# Patient Record
Sex: Female | Born: 2005
Health system: Southern US, Community
[De-identification: ages and names within clinical notes are randomized; demographics above are authoritative.]

## PROBLEM LIST (undated history)

## (undated) DIAGNOSIS — H539 Unspecified visual disturbance: Secondary | ICD-10-CM

## (undated) DIAGNOSIS — T7840XA Allergy, unspecified, initial encounter: Secondary | ICD-10-CM

## (undated) HISTORY — DX: Allergy, unspecified, initial encounter: T78.40XA

## (undated) HISTORY — DX: Unspecified visual disturbance: H53.9

---
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
## 2023-01-03 ENCOUNTER — Other Ambulatory Visit (HOSPITAL_COMMUNITY): Payer: Self-pay

## 2023-02-08 ENCOUNTER — Other Ambulatory Visit (HOSPITAL_COMMUNITY): Payer: Self-pay

## 2023-03-12 ENCOUNTER — Other Ambulatory Visit (HOSPITAL_COMMUNITY): Payer: Self-pay

## 2023-04-13 ENCOUNTER — Other Ambulatory Visit (HOSPITAL_COMMUNITY): Payer: Self-pay

## 2023-04-24 LAB — T4, FREE: Free T4: 1.2 ng/dL (ref 0.8–1.4)

## 2023-04-24 LAB — TSH: TSH: 0.47 mIU/L — ABNORMAL LOW

## 2023-04-27 ENCOUNTER — Ambulatory Visit (INDEPENDENT_AMBULATORY_CARE_PROVIDER_SITE_OTHER): Payer: Medicaid Other | Admitting: Pediatrics

## 2023-04-27 ENCOUNTER — Encounter (INDEPENDENT_AMBULATORY_CARE_PROVIDER_SITE_OTHER): Payer: Self-pay | Admitting: Pediatrics

## 2023-04-27 ENCOUNTER — Other Ambulatory Visit (HOSPITAL_COMMUNITY): Payer: Self-pay

## 2023-04-27 VITALS — BP 120/76 | HR 74 | Wt 213.0 lb

## 2023-04-27 DIAGNOSIS — E781 Pure hyperglyceridemia: Secondary | ICD-10-CM

## 2023-04-27 DIAGNOSIS — E8881 Metabolic syndrome: Secondary | ICD-10-CM | POA: Insufficient documentation

## 2023-04-27 DIAGNOSIS — E559 Vitamin D deficiency, unspecified: Secondary | ICD-10-CM

## 2023-04-27 DIAGNOSIS — E282 Polycystic ovarian syndrome: Secondary | ICD-10-CM | POA: Diagnosis not present

## 2023-04-27 DIAGNOSIS — Z68.41 Body mass index (BMI) pediatric, greater than or equal to 95th percentile for age: Secondary | ICD-10-CM

## 2023-04-27 DIAGNOSIS — E063 Autoimmune thyroiditis: Secondary | ICD-10-CM | POA: Diagnosis not present

## 2023-04-27 DIAGNOSIS — E786 Lipoprotein deficiency: Secondary | ICD-10-CM

## 2023-04-27 HISTORY — DX: Metabolic syndrome: E88.810

## 2023-04-27 MED ORDER — LEVOTHYROXINE SODIUM 125 MCG PO TABS
125.0000 ug | ORAL_TABLET | Freq: Every day | ORAL | 5 refills | Status: DC
  Filled 2023-04-27 – 2023-05-18 (×2): qty 30, 30d supply, fill #0
  Filled 2023-06-27: qty 30, 30d supply, fill #1
  Filled 2023-07-27: qty 30, 30d supply, fill #2
  Filled 2023-08-29: qty 30, 30d supply, fill #3
  Filled 2023-10-02: qty 30, 30d supply, fill #4

## 2023-04-27 NOTE — Progress Notes (Signed)
Pediatric Endocrinology Consultation Follow-up Visit Rebecca [name] 2006-06-14 [id number] [name], NP (Inactive)   HPI: Rebecca [name]  is a 17 y.o. female presenting for follow-up of Metabolic syndrome, Hypercholesterolemia, and PCOS .  she is unaccompanied to this visit. Interpreter present throughout the visit: No.  Rebecca [name] was last seen at [facility] on 10/27/2022.  Since last visit, she is taking levothyroxine daily with only one missed dose. No symptoms of thyroid disease. She is still taking OTC vit D. She still has irregular periods. She is thinking about studying abroad in [place].   ROS: Greater than 10 systems reviewed with pertinent positives listed in HPI, otherwise neg. The following portions of the patient's history were reviewed and updated as appropriate:  Past Medical History:  has a past medical history of Autoimmune hypothyroidism (2017), Metabolic syndrome (04/27/2023), PCOS (polycystic ovarian syndrome) (10/27/2022), Vision abnormalities, and Vitamin D deficiency (06/14/2016).  Meds: Current Outpatient Medications  Medication Instructions   cholecalciferol (VITAMIN D3) 1,000 Units, Oral, Daily   levothyroxine (SYNTHROID) 125 mcg, Oral, Daily    Allergies: Allergies  Allergen Reactions   Milk-Related Compounds    Shellfish Allergy     All seafood    Surgical History: No past surgical history on file.  Family History: family history is not on file.  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     reports that she has never smoked. She has never been exposed to tobacco smoke. She has never used smokeless tobacco. She reports that she does not drink alcohol and does not use drugs.  Physical Exam:  Vitals:   04/27/23 0952  BP: 120/76  Pulse: 74  Weight: 213 lb (96.6 kg)   BP 120/76   Pulse 74   Wt 213 lb (96.6 kg)   BMI 40.63 kg/m   Body mass index: body mass index is 40.63 kg/m. Blood pressure %iles are not available for patients who are 18 years or older. >99 %ile (Z= 2.34) based on CDC (Girls, 2-20 Years) BMI-for-age data using weight from 04/27/2023 and height from 10/27/2022.  Wt Readings from Last 3 Encounters:  04/27/23 213 lb (96.6 kg) (98 %, Z= 2.12)*  10/27/22 207 lb 9.6 oz (94.2 kg) (98 %, Z= 2.07)*  09/08/22 208 lb 6.4 oz (94.5 kg) (98 %, Z= 2.08)*   * Growth percentiles are based on CDC (Girls, 2-20 Years) data.   Ht Readings from Last 3 Encounters:  10/27/22 5' 0.71" (1.542 m) (8 %, Z= -1.38)*  09/08/22 5' 0.87" (1.546 m) (9 %, Z= -1.32)*  05/17/22 5' 0.55" (1.538 m) (8 %, Z= -1.44)*   * Growth percentiles are based on CDC (Girls, 2-20 Years) data.   Physical Exam Vitals reviewed.  Constitutional:      Appearance: Normal appearance. She is not toxic-appearing.  HENT:     Head: Normocephalic and atraumatic.     Nose: Nose normal.     Mouth/Throat:     Mouth: Mucous membranes are moist.  Eyes:     Extraocular Movements: Extraocular movements intact.  Neck:     Comments: No goiter, cobblestoning texture, no nodules Cardiovascular:     Heart sounds: Normal heart sounds.  Pulmonary:     Effort: Pulmonary effort is normal. No respiratory distress.     Breath sounds: Normal breath sounds.  Abdominal:     General: There is no  distension.  Musculoskeletal:        General: Normal range of motion.     Cervical back: Normal range of motion and neck supple.  Skin:    Capillary Refill: Capillary refill takes less than 2 seconds.     Comments: acanthosis  Neurological:     General: No focal deficit present.     Mental Status: She is alert.     Gait: Gait normal.  Psychiatric:        Mood and Affect: Mood normal.        Behavior: Behavior normal.      Labs: Results for orders placed or performed in visit on 10/27/22  T4, free  Result Value Ref Range   Free T4 1.2 0.8 - 1.4 ng/dL  TSH   Result Value Ref Range   TSH 0.47 (L) mIU/L    Assessment/Plan: Rebecca [name] is a 17 y.o. female with The primary encounter diagnosis was Hypothyroidism, acquired, autoimmune. Diagnoses of PCOS (polycystic ovarian syndrome), Hypertriglyceridemia, Low HDL (under 40), Morbid obesity (HCC), Vitamin D deficiency, and Metabolic syndrome were also pertinent to this visit.  Rebecca [name] was seen today for hypothyroidism.  Hypothyroidism, acquired, autoimmune Overview: Autoimmune hypothyroidism (TPO Ab >900, TH Ab >1000) treated with levothyroxine. she established care with [facility] of Endocrinology 09/19/2016 with Dr. Fransico Michael and transitioned care to me 09/07/2022.  Assessment & Plan: -clinically euthyroid  -TSH at lower end, normal thyroxine level -she has gained weight and does not want to gain more, so will not change dose of levo -Continue levothyroxine daily -repeat TFTs before next visit  Orders: -     Levothyroxine Sodium; Take 1 tablet (125 mcg total) by mouth daily.  Dispense: 30 tablet; Refill: 5 -     T4, free -     TSH -     T3  PCOS (polycystic ovarian syndrome) Overview: PCOS diagnosed as she had elevated testosterone with irregular menses, acne and hirsutism. She has decided to not treat with medication.   Assessment & Plan: Irregular menses persists No tx desired  Orders: -     DHEA-sulfate -     Testosterone, free  Hypertriglyceridemia -     Lipid panel  Low HDL (under 40)  Morbid obesity (HCC) Assessment & Plan: -She has gained 6 pounds in 6 months due to eating out more.  -Dietician offered and she declined.  Orders: -     T4, free -     TSH -     T3 -     Lipid panel -     Hemoglobin A1c -     Comprehensive metabolic panel -     VITAMIN D 25 Hydroxy (Vit-D Deficiency, Fractures) -     DHEA-sulfate -     Testosterone, free  Vitamin D deficiency -     VITAMIN D 25 Hydroxy (Vit-D Deficiency, Fractures)  Metabolic  syndrome Overview: Metabolic syndrome diagnosed as she has acanthosis one exam, PCOS, low HDL, elevated Triglycerides and low vitamin D level. She has seen dietician in the past.   Assessment & Plan: She is at risk of developing diabetes and cardiovascular disease. Again encouraged healthy choices and to continue exercising over the summer.  Orders: -     Lipid panel -     Hemoglobin A1c -     Comprehensive metabolic panel    Patient Instructions  It was a pleasure seeing you today and we will continue your same dose of levothyroxine daily.  Please obtain FASTING labs 1-2 days before the next visit. Remember to get labs done BEFORE the dose of levothyroxine, or 6 hours AFTER the dose of levothyroxine.  [facility] is in our office Monday, Tuesday, Wednesday and Friday from 8AM-4PM, closed for lunch 12pm-1pm. On Thursday, you can go to the third floor, Pediatric Neurology office at [address], [place], [place] [address]. You do not need an appointment, as they see patients in the order they arrive.  Let the front staff know that you are here for labs, and they will help you get to the [facility] lab.    What is thyroid hormone?  Thyroid hormone is the medication prescribed by your child's doctor to treat hypothyroidism, also known as an underactive thyroid gland. The body makes 2 forms of thyroid hormone, levothyroxine (T4) and triiodothyronine (T3). Generally, prescribed thyroid hormone comes in the form of T4, which is converted by the body to the active form, T3. This medication is available in generic form as levothyroxine. Brand names you may encounter for this medication include Levothroid, Levoxyl, Synthroid,  and Unithroid. This medication comes in pill form. Babies who need thyroid hormone because of hypothyroidism must be given this medication on a regular basis so that their brains will develop normally. Babies and older children also need thyroid hormone for normal growth, among other  important body functions.  How should thyroid hormone be given?  For babies and small children, because there is no reliable liquid preparation, the pill should be crushed just before administration and mixed with a small volume of water, human (breast) milk, or formula. This mixture can be given to the baby or small child using a spoon, dropper, or infant syringe. The spoon, dropper, or syringe should be "washed through" with more liquid 2 more times until all the thyroid hormone has been given. Making a mixture of crushed tablets and water or formula for storage is not recommended because this preparation is not stable. Some pharmacies will prepare a compounded suspension of levothyroxine, but it is only guaranteed to be stable for a month and it is more expensive. Levothyroxine is tasteless and should not be a  problem to give.  Older children and teens should be encouraged to swallow the pills whole or with water or to chew the pills if they cannot swallow them. In general, thyroid hormone should be given at the same time of day every day. Despite the instructions you may receive from your pharmacy, thyroid hormone does not need to be taken on an empty stomach. However, its absorption may be affected by food, so it should be taken consistently with or without food.   However, please avoid consuming the following foods or supplements with the thyroid hormone because they may prevent the medicine from being fully absorbed:   Soy protein formulas or soy milk  Concentrated iron  Calcium supplements, aluminum hydroxide  Fiber supplements  Sucralfate  You do not need to worry about thyroid hormones interacting with other medications, as the medicine simply replaces a hormone that your child is no longer able to make. A good way to keep track of your child's doses is to get a 7-day pillbox and fill it at the beginning of the week. If one dose is missed, that dose should be taken as soon as possible. If  you find out one day that the previous dose was missed, it is fine to double the dose the next day.  What are the side effects  of thyroid hormone medication?  The rare side effects of thyroid hormone medication are related to overdose, or too much medication, and can include rapid heart rate, sweating, anxiety, and tremors. If your child experiences these signs and symptoms, you should contact the physician who prescribed the medication for your child. A child will not have these problems if the thyroid hormone dose prescribed is only slightly more than is needed.  Is it OK to switch between brands of thyroid hormone medication?  Some endocrinologists believe that this may not always be a good idea. It is possible that different brands have different bioavailability of the "free" hormone; therefore, if you need to switch between name brands or switch from a name brand to generic levothyroxine, you should let your endocrinologist know so your child's thyroid functions can be checked if the endocrinologist feels it is necessary to do so. Once-daily administration and close follow-up with your endocrinologist is needed to ensure the best possible results.  Pediatric Endocrinology Fact Sheet Thyroid Hormone Administration: A Guide for Families Copyright  2018 American Academy of Pediatrics and Pediatric Endocrine Society. All rights reserved. The information contained in this publication should not be used as a substitute for the medical care and advice of your pediatrician. There may be variations in treatment that your pediatrician may recommend based on individual facts and circumstances. Pediatric Endocrine Society/American Academy of Pediatrics  Section on Endocrinology Patient Education Committee   Follow-up:   Return in about 6 months (around 10/28/2023) for to review studies, follow up.  Medical decision-making:  I have personally spent 48 minutes involved in face-to-face and non-face-to-face  activities for this patient on the day of the visit. Professional time spent includes the following activities, in addition to those noted in the documentation: preparation time/chart review, ordering of medications/tests/procedures, obtaining and/or reviewing separately obtained history, counseling and educating the patient/family/caregiver, performing a medically appropriate examination and/or evaluation, referring and communicating with other health care professionals for care coordination, and documentation in the EHR.  Thank you for the opportunity to participate in the care of your patient. Please do not hesitate to contact me should you have any questions regarding the assessment or treatment plan.   Sincerely,   Silvana Newness, MD

## 2023-04-27 NOTE — Assessment & Plan Note (Signed)
-  She has gained 6 pounds in 6 months due to eating out more.  -Dietician offered and she declined.

## 2023-04-27 NOTE — Assessment & Plan Note (Signed)
Irregular menses persists No tx desired

## 2023-04-27 NOTE — Assessment & Plan Note (Signed)
She is at risk of developing diabetes and cardiovascular disease. Again encouraged healthy choices and to continue exercising over the summer.

## 2023-04-27 NOTE — Assessment & Plan Note (Signed)
-  clinically euthyroid  -TSH at lower end, normal thyroxine level -she has gained weight and does not want to gain more, so will not change dose of levo -Continue levothyroxine daily -repeat TFTs before next visit

## 2023-04-27 NOTE — Patient Instructions (Signed)
It was a pleasure seeing you today and we will continue your same dose of levothyroxine daily.   Please obtain FASTING labs 1-2 days before the next visit. Remember to get labs done BEFORE the dose of levothyroxine, or 6 hours AFTER the dose of levothyroxine.  [facility] is in our office Monday, Tuesday, Wednesday and Friday from 8AM-4PM, closed for lunch 12pm-1pm. On Thursday, you can go to the third floor, Pediatric Neurology office at [address], [place], [place] [address]. You do not need an appointment, as they see patients in the order they arrive.  Let the front staff know that you are here for labs, and they will help you get to the [facility] lab.    What is thyroid hormone?  Thyroid hormone is the medication prescribed by your child's doctor to treat hypothyroidism, also known as an underactive thyroid gland. The body makes 2 forms of thyroid hormone, levothyroxine (T4) and triiodothyronine (T3). Generally, prescribed thyroid hormone comes in the form of T4, which is converted by the body to the active form, T3. This medication is available in generic form as levothyroxine. Brand names you may encounter for this medication include Levothroid, Levoxyl, Synthroid,  and Unithroid. This medication comes in pill form. Babies who need thyroid hormone because of hypothyroidism must be given this medication on a regular basis so that their brains will develop normally. Babies and older children also need thyroid hormone for normal growth, among other important body functions.  How should thyroid hormone be given?  For babies and small children, because there is no reliable liquid preparation, the pill should be crushed just before administration and mixed with a small volume of water, human (breast) milk, or formula. This mixture can be given to the baby or small child using a spoon, dropper, or infant syringe. The spoon, dropper, or syringe should be "washed through" with more liquid 2 more times until all the  thyroid hormone has been given. Making a mixture of crushed tablets and water or formula for storage is not recommended because this preparation is not stable. Some pharmacies will prepare a compounded suspension of levothyroxine, but it is only guaranteed to be stable for a month and it is more expensive. Levothyroxine is tasteless and should not be a  problem to give.  Older children and teens should be encouraged to swallow the pills whole or with water or to chew the pills if they cannot swallow them. In general, thyroid hormone should be given at the same time of day every day. Despite the instructions you may receive from your pharmacy, thyroid hormone does not need to be taken on an empty stomach. However, its absorption may be affected by food, so it should be taken consistently with or without food.   However, please avoid consuming the following foods or supplements with the thyroid hormone because they may prevent the medicine from being fully absorbed:   Soy protein formulas or soy milk  Concentrated iron  Calcium supplements, aluminum hydroxide  Fiber supplements  Sucralfate  You do not need to worry about thyroid hormones interacting with other medications, as the medicine simply replaces a hormone that your child is no longer able to make. A good way to keep track of your child's doses is to get a 7-day pillbox and fill it at the beginning of the week. If one dose is missed, that dose should be taken as soon as possible. If you find out one day that the previous dose  was missed, it is fine to double the dose the next day.  What are the side effects of thyroid hormone medication?  The rare side effects of thyroid hormone medication are related to overdose, or too much medication, and can include rapid heart rate, sweating, anxiety, and tremors. If your child experiences these signs and symptoms, you should contact the physician who prescribed the medication for your child. A child will  not have these problems if the thyroid hormone dose prescribed is only slightly more than is needed.  Is it OK to switch between brands of thyroid hormone medication?  Some endocrinologists believe that this may not always be a good idea. It is possible that different brands have different bioavailability of the "free" hormone; therefore, if you need to switch between name brands or switch from a name brand to generic levothyroxine, you should let your endocrinologist know so your child's thyroid functions can be checked if the endocrinologist feels it is necessary to do so. Once-daily administration and close follow-up with your endocrinologist is needed to ensure the best possible results.  Pediatric Endocrinology Fact Sheet Thyroid Hormone Administration: A Guide for Families Copyright  2018 American Academy of Pediatrics and Pediatric Endocrine Society. All rights reserved. The information contained in this publication should not be used as a substitute for the medical care and advice of your pediatrician. There may be variations in treatment that your pediatrician may recommend based on individual facts and circumstances. Pediatric Endocrine Society/American Academy of Pediatrics  Section on Endocrinology Patient Education Committee

## 2023-05-18 ENCOUNTER — Other Ambulatory Visit (HOSPITAL_COMMUNITY): Payer: Self-pay

## 2023-05-18 ENCOUNTER — Other Ambulatory Visit: Payer: Self-pay

## 2023-06-27 ENCOUNTER — Other Ambulatory Visit (HOSPITAL_COMMUNITY): Payer: Self-pay

## 2023-07-27 ENCOUNTER — Other Ambulatory Visit (HOSPITAL_COMMUNITY): Payer: Self-pay

## 2023-08-29 ENCOUNTER — Other Ambulatory Visit (HOSPITAL_COMMUNITY): Payer: Self-pay

## 2023-10-02 ENCOUNTER — Other Ambulatory Visit (HOSPITAL_COMMUNITY): Payer: Self-pay

## 2023-10-05 ENCOUNTER — Other Ambulatory Visit (HOSPITAL_COMMUNITY): Payer: Self-pay

## 2023-10-26 NOTE — Progress Notes (Signed)
 Pediatric Endocrinology Consultation Follow-up Visit Rebecca [name] 20-Oct-2006 [id number] Tess Barns, NP (Inactive)   HPI: Rebecca [name]  is a 18 y.o. female presenting for follow-up of Metabolic syndrome, PCOS, and Hypothyroidism.  she is accompanied to this visit by her  self . Interpreter present throughout the visit: No.  Taj was last seen at [facility] on 04/27/2023.  Since last visit, she has not yet transitioned to adult endo. Daryl Robb has been taking levo 125mcg with 1-2 missed doses. There has been no heat/cold intolerance, constipation/diarrhea, rapid heart rate, tremor, mood changes, poor energy, fatigue, dry skin, nor brittle hair/hair loss. She has had more hirsutism. She is still ok with not having menses. LMP September 2024 x 1 week duration.   ROS: Greater than 10 systems reviewed with pertinent positives listed in HPI, otherwise neg. The following portions of the patient's history were reviewed and updated as appropriate:  Past Medical History:  has a past medical history of Allergy, Autoimmune hypothyroidism (2017), Metabolic syndrome (04/27/2023), PCOS (polycystic ovarian syndrome) (10/27/2022), Vision abnormalities, and Vitamin D  deficiency (06/14/2016).  Meds: Current Outpatient Medications  Medication Instructions   cholecalciferol (VITAMIN D3) 1,000 Units, Daily   ergocalciferol  (VITAMIN D2) 50,000 Units, Oral, Weekly   levothyroxine  (SYNTHROID ) 137 mcg, Oral, Daily    Allergies: Allergies  Allergen Reactions   Milk-Related Compounds    Shellfish Allergy     All seafood    Surgical History: History reviewed. No pertinent surgical history.  Family History: family history is not on file.  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     reports that she has never smoked. She has never been exposed to tobacco smoke. She has never used  smokeless tobacco. She reports that she does not drink alcohol and does not use drugs.  Physical Exam:  Vitals:   10/29/23 0951  BP: 112/66  Pulse: 89  Weight: 223 lb 6.4 oz (101.3 kg)   BP 112/66   Pulse 89   Wt 223 lb 6.4 oz (101.3 kg)   BMI 42.62 kg/m  Body mass index: body mass index is 42.62 kg/m. Blood pressure %iles are not available for patients who are 18 years or older. >99 %ile (Z= 2.47) based on CDC (Girls, 2-20 Years) BMI-for-age data using weight from 10/29/2023 and height from 10/27/2022.  Wt Readings from Last 3 Encounters:  10/29/23 223 lb 6.4 oz (101.3 kg) (99%, Z= 2.24)*  04/27/23 213 lb (96.6 kg) (98%, Z= 2.12)*  10/27/22 207 lb 9.6 oz (94.2 kg) (98%, Z= 2.07)*   * Growth percentiles are based on CDC (Girls, 2-20 Years) data.   Ht Readings from Last 3 Encounters:  10/27/22 5' 0.71 (1.542 m) (8%, Z= -1.38)*  09/08/22 5' 0.87 (1.546 m) (9%, Z= -1.32)*  05/17/22 5' 0.55 (1.538 m) (8%, Z= -1.44)*   * Growth percentiles are based on CDC (Girls, 2-20 Years) data.   Physical Exam Vitals reviewed.  Constitutional:      Appearance: Normal appearance. She is not toxic-appearing.  HENT:     Head: Normocephalic and atraumatic.     Nose: Nose normal.     Mouth/Throat:     Mouth: Mucous membranes are moist.  Eyes:     Extraocular Movements: Extraocular movements intact.     Comments: glasses  Neck:     Comments: Goiter, no nodules Cardiovascular:  Heart sounds: Normal heart sounds.  Pulmonary:     Effort: Pulmonary effort is normal. No respiratory distress.     Breath sounds: Normal breath sounds.  Chest:     Chest wall: Tenderness: er.  Abdominal:     General: There is no distension.  Musculoskeletal:        General: Normal range of motion.     Cervical back: Normal range of motion and neck supple.  Skin:    General: Skin is warm.     Capillary Refill: Capillary refill takes less than 2 seconds.     Comments: Mild acanthosis  Neurological:      General: No focal deficit present.     Mental Status: She is alert.     Gait: Gait normal.  Psychiatric:        Mood and Affect: Mood normal.        Behavior: Behavior normal.      Labs: Results for orders placed or performed in visit on 04/27/23  T4, free   Collection Time: 10/25/23  9:40 AM  Result Value Ref Range   Free T4 1.0 0.8 - 1.4 ng/dL  TSH   Collection Time: 10/25/23  9:40 AM  Result Value Ref Range   TSH 10.39 (H) mIU/L  T3   Collection Time: 10/25/23  9:40 AM  Result Value Ref Range   T3, Total 88 86 - 192 ng/dL  Lipid panel   Collection Time: 10/25/23  9:40 AM  Result Value Ref Range   Cholesterol 174 (H) <170 mg/dL   HDL 40 (L) >54 mg/dL   Triglycerides 846 (H) <90 mg/dL   LDL Cholesterol (Calc) 107 <110 mg/dL (calc)   Total CHOL/HDL Ratio 4.4 <5.0 (calc)   Non-HDL Cholesterol (Calc) 134 (H) <120 mg/dL (calc)  Hemoglobin J8r   Collection Time: 10/25/23  9:40 AM  Result Value Ref Range   Hgb A1c MFr Bld 5.8 (H) <5.7 % of total Hgb   Mean Plasma Glucose 120 mg/dL   eAG (mmol/L) 6.6 mmol/L  Comprehensive metabolic panel   Collection Time: 10/25/23  9:40 AM  Result Value Ref Range   Glucose, Bld 88 65 - 99 mg/dL   BUN 9 7 - 20 mg/dL   Creat 9.35 9.49 - 9.03 mg/dL   BUN/Creatinine Ratio SEE NOTE: 6 - 22 (calc)   Sodium 137 135 - 146 mmol/L   Potassium 4.6 3.8 - 5.1 mmol/L   Chloride 102 98 - 110 mmol/L   CO2 28 20 - 32 mmol/L   Calcium 9.4 8.9 - 10.4 mg/dL   Total Protein 7.3 6.3 - 8.2 g/dL   Albumin 4.3 3.6 - 5.1 g/dL   Globulin 3.0 2.0 - 3.8 g/dL (calc)   AG Ratio 1.4 1.0 - 2.5 (calc)   Total Bilirubin 0.2 0.2 - 1.1 mg/dL   Alkaline phosphatase (APISO) 70 36 - 128 U/L   AST 15 12 - 32 U/L   ALT 13 5 - 32 U/L  VITAMIN D  25 Hydroxy (Vit-D Deficiency, Fractures)   Collection Time: 10/25/23  9:40 AM  Result Value Ref Range   Vit D, 25-Hydroxy 21 (L) 30 - 100 ng/mL  DHEA-sulfate   Collection Time: 10/25/23  9:40 AM  Result Value Ref Range    DHEA-SO4 145 44 - 286 mcg/dL  Testosterone , free   Collection Time: 10/25/23  9:40 AM  Result Value Ref Range   TESTOSTERONE  FREE 9.4 (H) 0.2 - 5.0 pg/mL    Assessment/Plan: Dotty was seen  today for hypothyroidism.  Hypothyroidism, acquired, autoimmune Overview: Autoimmune hypothyroidism (TPO Ab >900, TH Ab >1000) treated with levothyroxine . she established care with [facility] of Endocrinology 09/19/2016 with Dr. Hershal and transitioned care to me 09/07/2022.  Assessment & Plan: -clinically euthyroid except for weight gain, worsening hyperlipidemia and worsening glycemic control -TSH >10, normal thyroxine -She reports compliance, so will increase levothyroxine  daily with repeat TFTs in 8 weeks ad before the next visit  Orders: -     Ambulatory referral to Endocrinology -     Levothyroxine  Sodium; Take 1 tablet (137 mcg total) by mouth daily.  Dispense: 30 tablet; Refill: 5 -     Ergocalciferol ; Take 1 capsule (50,000 Units total) by mouth once a week for 8 doses.  Dispense: 8 capsule; Refill: 0 -     Amb Ref to Medical Weight Management -     T4, free -     TSH -     T4, free; Future -     TSH; Future  Metabolic syndrome Overview: Metabolic syndrome diagnosed as she now has prediabetes (10/29/2023) with history of acanthosis on exam, PCOS, low HDL, elevated triglycerides and low vitamin D  level. She has seen dietician in the past.   Assessment & Plan: She is at risk of developing diabetes and cardiovascular disease, and now has prediabetes with acanthosis.  Again encouraged healthy choices and to continue exercising. Step counter downloaded on phone with goal of 5000 steps. -referral to healthy weight management -referral to adult endocrinology  Orders: -     Ambulatory referral to Endocrinology -     Levothyroxine  Sodium; Take 1 tablet (137 mcg total) by mouth daily.  Dispense: 30 tablet; Refill: 5 -     Ergocalciferol ; Take 1 capsule  (50,000 Units total) by mouth once a week for 8 doses.  Dispense: 8 capsule; Refill: 0 -     Amb Ref to Medical Weight Management -     Hemoglobin A1c; Future -     Lipid panel; Future -     T4, free; Future -     TSH; Future -     Testosterone , free; Future  Prediabetes Overview: HbA1c 5.8% January 2025.  Assessment & Plan: -declined metformin -treat with lifestyle changes -POCT HbA1c in 3 months  Orders: -     Ambulatory referral to Endocrinology -     Amb Ref to Medical Weight Management -     Hemoglobin A1c; Future  Goiter  Mixed hyperlipidemia  PCOS (polycystic ovarian syndrome) Overview: PCOS diagnosed as she had elevated testosterone  with irregular menses, acne and hirsutism. She has decided to not treat with medication.   Assessment & Plan: -free testosterone  increasing -Irregular menses persists -No tx desired  Orders: -     Ambulatory referral to Endocrinology -     Levothyroxine  Sodium; Take 1 tablet (137 mcg total) by mouth daily.  Dispense: 30 tablet; Refill: 5 -     Ergocalciferol ; Take 1 capsule (50,000 Units total) by mouth once a week for 8 doses.  Dispense: 8 capsule; Refill: 0 -     Amb Ref to Medical Weight Management -     Testosterone , free; Future  Vitamin D  deficiency Assessment & Plan: -vitamin D  <30, which increases risk of progression to diabetes -Rx provided -repeat level with next set of labs  Orders: -     Ergocalciferol ; Take 1 capsule (50,000 Units total) by mouth once a week for 8 doses.  Dispense: 8 capsule;  Refill: 0 -     VITAMIN D  25 Hydroxy (Vit-D Deficiency, Fractures)  Counseling for transition from pediatric to adult care provider Assessment & Plan: -referred to adult endo  Orders: -     Ambulatory referral to Endocrinology -     Levothyroxine  Sodium; Take 1 tablet (137 mcg total) by mouth daily.  Dispense: 30 tablet; Refill: 5 -     Ergocalciferol ; Take 1 capsule (50,000 Units total) by mouth once a week for 8 doses.   Dispense: 8 capsule; Refill: 0 -     Amb Ref to Medical Weight Management  Class 2 severe obesity due to excess calories with serious comorbidity and body mass index (BMI) of 39.0 to 39.9 in adult [facility]) Assessment & Plan: -Weight gain of  10 pounds in 6 months -referred   Hypertriglyceridemia Assessment & Plan: Worsening -recommend avoiding sugary drinks     Patient Instructions    Latest Reference Range & Units 10/25/23 09:40  Sodium 135 - 146 mmol/L 137  Potassium 3.8 - 5.1 mmol/L 4.6  Chloride 98 - 110 mmol/L 102  CO2 20 - 32 mmol/L 28  Glucose 65 - 99 mg/dL 88  Mean Plasma Glucose mg/dL 879  BUN 7 - 20 mg/dL 9  Creatinine 9.49 - 9.03 mg/dL 9.35  Calcium 8.9 - 89.5 mg/dL 9.4  BUN/Creatinine Ratio 6 - 22 (calc) SEE NOTE:  AG Ratio 1.0 - 2.5 (calc) 1.4  AST 12 - 32 U/L 15  ALT 5 - 32 U/L 13  Total Protein 6.3 - 8.2 g/dL 7.3  Total Bilirubin 0.2 - 1.1 mg/dL 0.2  Total CHOL/HDL Ratio <5.0 (calc) 4.4  Cholesterol <170 mg/dL 825 (H)  HDL Cholesterol >45 mg/dL 40 (L)  LDL Cholesterol (Calc) <110 mg/dL (calc) 892  Non-HDL Cholesterol (Calc) <120 mg/dL (calc) 865 (H)  Triglycerides <90 mg/dL 846 (H)  Alkaline phosphatase (APISO) 36 - 128 U/L 70  Vitamin D , 25-Hydroxy 30 - 100 ng/mL 21 (L)  Globulin 2.0 - 3.8 g/dL (calc) 3.0  DHEA-SO4 44 - 286 mcg/dL 854  eAG (mmol/L) mmol/L 6.6  Hemoglobin A1C <5.7 % of total Hgb 5.8 (H)  Testosterone  Free 0.2 - 5.0 pg/mL 9.4 (H)  TSH mIU/L 10.39 (H)  Triiodothyronine (T3) 86 - 192 ng/dL 88  U5,Qmzz(Ipmzru) 0.8 - 1.4 ng/dL 1.0  Albumin MSPROF 3.6 - 5.1 g/dL 4.3  (H): Data is abnormally high (L): Data is abnormally low   HbA1c Goals: Our ultimate goal is to achieve the lowest possible HbA1c while avoiding recurrent severe hypoglycemia.  However all HbA1c goals must be individualized per American Diabetes Association guidelines.  My Hemoglobin A1c History:  Lab Results  Component Value Date   HGBA1C 5.8 (H) 10/25/2023   HGBA1C  5.2 11/01/2021   HGBA1C 5.5 06/29/2021   HGBA1C 5.3 01/13/2021   HGBA1C 5.5 09/09/2020   HGBA1C 5.5 02/05/2020   HGBA1C 5.6 01/29/2018   HGBA1C 5.6 09/21/2017   HGBA1C 5.3 12/22/2016    My goal HbA1c is: < 5.7 %  This is equivalent to an average blood glucose of:  HbA1c % = Average BG 5.7  117      6  120   7  150     Medications: none, but have referred to see the dietician. Recommendations for healthy eating  Never skip breakfast. Try to have at least 10 grams of protein (glass of milk, eggs, shake, or breakfast bar). No soda, juice, or sweetened drinks. Limit starches/carbohydrates to 1 fist per meal at  breakfast, lunch and dinner. No eating after dinner. Eat three meals per day and dinner should be with the family. Limit of one snack daily, after school. All snacks should be a fruit or vegetables without dressing. Avoid bananas/grapes. Low carb fruits: berries, green apple, cantaloupe, honeydew No breaded or fried foods. Increase water intake, drink ice cold water 8 to 10 ounces before eating. Exercise daily for 30 to 60 minutes. Walking goal of 5000 steps per day. For insomnia or inability to stay asleep at night: Sleep App: Insomnia Coach  Meditate: Headspace on Netflix has guided meditation or Youtube Apps: Calm or Headspace have guided meditation  Thyroid:    Medication: increase to levothyroxine  137mcg daily   Laboratory studies:  Please obtain labs in 8 weeks. Remember to get labs done BEFORE the dose of levothyroxine , or 6 hours AFTER the dose of levothyroxine .  [facility] is in our office Monday, Tuesday, Wednesday and Friday from 8AM-4PM, closed for lunch 12pm-1pm. On Thursday, you can go to the third floor, Pediatric Neurology office at [address] [place], [place] [address]. You do not need an appointment, as they see patients in the order they arrive.  Let the front staff know that you are here for labs, and they will help you get to the [facility].   --Also get  fasting labs before the next visit-- Education: What is thyroid hormone?  Thyroid hormone is the medication prescribed by your child's doctor to treat hypothyroidism, also known as an underactive thyroid gland. The body makes 2 forms of thyroid hormone, levothyroxine  (T4) and triiodothyronine (T3). Generally, prescribed thyroid hormone comes in the form of T4, which is converted by the body to the active form, T3. This medication is available in generic form as levothyroxine . Brand names you may encounter for this medication include Levothroid, Levoxyl , Synthroid ,  and Unithroid . This medication comes in pill form. Babies who need thyroid hormone because of hypothyroidism must be given this medication on a regular basis so that their brains will develop normally. Babies and older children also need thyroid hormone for normal growth, among other important body functions.  How should thyroid hormone be given?  For babies and small children, because there is no reliable liquid preparation, the pill should be crushed just before administration and mixed with a small volume of water, human (breast) milk, or formula. This mixture can be given to the baby or small child using a spoon, dropper, or infant syringe. The spoon, dropper, or syringe should be "washed through" with more liquid 2 more times until all the thyroid hormone has been given. Making a mixture of crushed tablets and water or formula for storage is not recommended because this preparation is not stable. Some pharmacies will prepare a compounded suspension of levothyroxine , but it is only guaranteed to be stable for a month and it is more expensive. Levothyroxine  is tasteless and should not be a  problem to give.  Older children and teens should be encouraged to swallow the pills whole or with water or to chew the pills if they cannot swallow them. In general, thyroid hormone should be given at the same time of day every day. Despite the  instructions you may receive from your pharmacy, thyroid hormone does not need to be taken on an empty stomach. However, its absorption may be affected by food, so it should be taken consistently with or without food.   However, please avoid consuming the following foods or supplements with the thyroid  hormone because they may prevent the medicine from being fully absorbed:   Soy protein formulas or soy milk  Concentrated iron  Calcium supplements, aluminum hydroxide  Fiber supplements  Sucralfate  You do not need to worry about thyroid hormones interacting with other medications, as the medicine simply replaces a hormone that your child is no longer able to make. A good way to keep track of your child's doses is to get a 7-day pillbox and fill it at the beginning of the week. If one dose is missed, that dose should be taken as soon as possible. If you find out one day that the previous dose was missed, it is fine to double the dose the next day.  What are the side effects of thyroid hormone medication?  The rare side effects of thyroid hormone medication are related to overdose, or too much medication, and can include rapid heart rate, sweating, anxiety, and tremors. If your child experiences these signs and symptoms, you should contact the physician who prescribed the medication for your child. A child will not have these problems if the thyroid hormone dose prescribed is only slightly more than is needed.  Is it OK to switch between brands of thyroid hormone medication?  Some endocrinologists believe that this may not always be a good idea. It is possible that different brands have different bioavailability of the "free" hormone; therefore, if you need to switch between name brands or switch from a name brand to generic levothyroxine , you should let your endocrinologist know so your child's thyroid functions can be checked if the endocrinologist feels it is necessary to do so. Once-daily  administration and close follow-up with your endocrinologist is needed to ensure the best possible results.  Pediatric Endocrinology Fact Sheet Thyroid Hormone Administration: A Guide for Families Copyright  2018 American Academy of Pediatrics and Pediatric Endocrine Society. All rights reserved. The information contained in this publication should not be used as a substitute for the medical care and advice of your pediatrician. There may be variations in treatment that your pediatrician may recommend based on individual facts and circumstances. Pediatric Endocrine Society/American Academy of Pediatrics  Section on Endocrinology Patient Education Committee   Follow-up:   Return in about 6 months (around 04/27/2024) for to review studies, follow up if you have not transitioned to adult endocrinology.  Medical decision-making:  I have personally spent 42 minutes involved in face-to-face and non-face-to-face activities for this patient on the day of the visit. Professional time spent includes the following activities, in addition to those noted in the documentation: preparation time/chart review, ordering of medications/tests/procedures, obtaining and/or reviewing separately obtained history, counseling and educating the patient/family/caregiver, performing a medically appropriate examination and/or evaluation, referring and communicating with other health care professionals for care coordination, dietary counseling, and documentation in the EHR.  Thank you for the opportunity to participate in the care of your patient. Please do not hesitate to contact me should you have any questions regarding the assessment or treatment plan.   Sincerely,   Marce Rucks, MD

## 2023-10-29 ENCOUNTER — Other Ambulatory Visit (HOSPITAL_COMMUNITY): Payer: Self-pay

## 2023-10-29 ENCOUNTER — Encounter (INDEPENDENT_AMBULATORY_CARE_PROVIDER_SITE_OTHER): Payer: Self-pay | Admitting: Pediatrics

## 2023-10-29 ENCOUNTER — Other Ambulatory Visit (HOSPITAL_BASED_OUTPATIENT_CLINIC_OR_DEPARTMENT_OTHER): Payer: Self-pay

## 2023-10-29 ENCOUNTER — Ambulatory Visit (INDEPENDENT_AMBULATORY_CARE_PROVIDER_SITE_OTHER): Payer: Self-pay | Admitting: Pediatrics

## 2023-10-29 VITALS — BP 112/66 | HR 89 | Wt 223.4 lb

## 2023-10-29 DIAGNOSIS — Z6839 Body mass index (BMI) 39.0-39.9, adult: Secondary | ICD-10-CM

## 2023-10-29 DIAGNOSIS — E282 Polycystic ovarian syndrome: Secondary | ICD-10-CM

## 2023-10-29 DIAGNOSIS — E8881 Metabolic syndrome: Secondary | ICD-10-CM | POA: Diagnosis not present

## 2023-10-29 DIAGNOSIS — E063 Autoimmune thyroiditis: Secondary | ICD-10-CM | POA: Diagnosis not present

## 2023-10-29 DIAGNOSIS — Z7187 Encounter for pediatric-to-adult transition counseling: Secondary | ICD-10-CM

## 2023-10-29 DIAGNOSIS — E66812 Obesity, class 2: Secondary | ICD-10-CM

## 2023-10-29 DIAGNOSIS — E782 Mixed hyperlipidemia: Secondary | ICD-10-CM

## 2023-10-29 DIAGNOSIS — E049 Nontoxic goiter, unspecified: Secondary | ICD-10-CM | POA: Diagnosis not present

## 2023-10-29 DIAGNOSIS — Z713 Dietary counseling and surveillance: Secondary | ICD-10-CM

## 2023-10-29 DIAGNOSIS — R7303 Prediabetes: Secondary | ICD-10-CM

## 2023-10-29 DIAGNOSIS — E781 Pure hyperglyceridemia: Secondary | ICD-10-CM

## 2023-10-29 DIAGNOSIS — E559 Vitamin D deficiency, unspecified: Secondary | ICD-10-CM

## 2023-10-29 LAB — LIPID PANEL
Cholesterol: 174 mg/dL — ABNORMAL HIGH (ref <170)
HDL: 40 mg/dL — ABNORMAL LOW (ref >45)
LDL Cholesterol (Calc): 107 mg/dL (ref <110)
Non-HDL Cholesterol (Calc): 134 mg/dL — ABNORMAL HIGH (ref <120)
Total CHOL/HDL Ratio: 4.4 (calc) (ref <5.0)
Triglycerides: 153 mg/dL — ABNORMAL HIGH (ref <90)

## 2023-10-29 LAB — TSH: TSH: 10.39 m[IU]/L — ABNORMAL HIGH

## 2023-10-29 LAB — COMPREHENSIVE METABOLIC PANEL
AG Ratio: 1.4 (calc) (ref 1.0–2.5)
ALT: 13 U/L (ref 5–32)
AST: 15 U/L (ref 12–32)
Albumin: 4.3 g/dL (ref 3.6–5.1)
Alkaline phosphatase (APISO): 70 U/L (ref 36–128)
BUN: 9 mg/dL (ref 7–20)
CO2: 28 mmol/L (ref 20–32)
Calcium: 9.4 mg/dL (ref 8.9–10.4)
Chloride: 102 mmol/L (ref 98–110)
Creat: 0.64 mg/dL (ref 0.50–0.96)
Globulin: 3 g/dL (ref 2.0–3.8)
Glucose, Bld: 88 mg/dL (ref 65–99)
Potassium: 4.6 mmol/L (ref 3.8–5.1)
Sodium: 137 mmol/L (ref 135–146)
Total Bilirubin: 0.2 mg/dL (ref 0.2–1.1)
Total Protein: 7.3 g/dL (ref 6.3–8.2)

## 2023-10-29 LAB — HEMOGLOBIN A1C
Hgb A1c MFr Bld: 5.8 %{Hb} — ABNORMAL HIGH (ref <5.7)
Mean Plasma Glucose: 120 mg/dL
eAG (mmol/L): 6.6 mmol/L

## 2023-10-29 LAB — VITAMIN D 25 HYDROXY (VIT D DEFICIENCY, FRACTURES): Vit D, 25-Hydroxy: 21 ng/mL — ABNORMAL LOW (ref 30–100)

## 2023-10-29 LAB — TESTOSTERONE, FREE: TESTOSTERONE FREE: 9.4 pg/mL — ABNORMAL HIGH (ref 0.2–5.0)

## 2023-10-29 LAB — T3: T3, Total: 88 ng/dL (ref 86–192)

## 2023-10-29 LAB — DHEA-SULFATE: DHEA-SO4: 145 ug/dL (ref 44–286)

## 2023-10-29 LAB — T4, FREE: Free T4: 1 ng/dL (ref 0.8–1.4)

## 2023-10-29 MED ORDER — LEVOTHYROXINE SODIUM 137 MCG PO TABS
137.0000 ug | ORAL_TABLET | Freq: Every day | ORAL | 5 refills | Status: DC
Start: 2023-10-29 — End: 2024-04-28
  Filled 2023-10-29: qty 30, 30d supply, fill #0
  Filled 2023-11-29: qty 30, 30d supply, fill #1
  Filled 2024-01-02: qty 30, 30d supply, fill #2
  Filled 2024-02-05: qty 30, 30d supply, fill #3
  Filled 2024-03-05: qty 30, 30d supply, fill #4
  Filled 2024-04-16: qty 30, 30d supply, fill #5

## 2023-10-29 MED ORDER — ERGOCALCIFEROL 1.25 MG (50000 UT) PO CAPS
50000.0000 [IU] | ORAL_CAPSULE | ORAL | 0 refills | Status: DC
Start: 2023-10-29 — End: 2024-04-28
  Filled 2023-10-29: qty 8, 56d supply, fill #0

## 2023-10-29 NOTE — Assessment & Plan Note (Signed)
 Worsening -recommend avoiding sugary drinks

## 2023-10-29 NOTE — Assessment & Plan Note (Signed)
-  free testosterone increasing -Irregular menses persists -No tx desired

## 2023-10-29 NOTE — Assessment & Plan Note (Signed)
-  referred to adult endo

## 2023-10-29 NOTE — Assessment & Plan Note (Signed)
 She is at risk of developing diabetes and cardiovascular disease, and now has prediabetes with acanthosis.  Again encouraged healthy choices and to continue exercising. Step counter downloaded on phone with goal of 5000 steps. -referral to healthy weight management -referral to adult endocrinology

## 2023-10-29 NOTE — Assessment & Plan Note (Signed)
-  Weight gain of  10 pounds in 6 months -referred

## 2023-10-29 NOTE — Patient Instructions (Addendum)
 Latest Reference Range & Units 10/25/23 09:40  Sodium 135 - 146 mmol/L 137  Potassium 3.8 - 5.1 mmol/L 4.6  Chloride 98 - 110 mmol/L 102  CO2 20 - 32 mmol/L 28  Glucose 65 - 99 mg/dL 88  Mean Plasma Glucose mg/dL 879  BUN 7 - 20 mg/dL 9  Creatinine 9.49 - 9.03 mg/dL 9.35  Calcium 8.9 - 89.5 mg/dL 9.4  BUN/Creatinine Ratio 6 - 22 (calc) SEE NOTE:  AG Ratio 1.0 - 2.5 (calc) 1.4  AST 12 - 32 U/L 15  ALT 5 - 32 U/L 13  Total Protein 6.3 - 8.2 g/dL 7.3  Total Bilirubin 0.2 - 1.1 mg/dL 0.2  Total CHOL/HDL Ratio <5.0 (calc) 4.4  Cholesterol <170 mg/dL 825 (H)  HDL Cholesterol >45 mg/dL 40 (L)  LDL Cholesterol (Calc) <110 mg/dL (calc) 892  Non-HDL Cholesterol (Calc) <120 mg/dL (calc) 865 (H)  Triglycerides <90 mg/dL 846 (H)  Alkaline phosphatase (APISO) 36 - 128 U/L 70  Vitamin D , 25-Hydroxy 30 - 100 ng/mL 21 (L)  Globulin 2.0 - 3.8 g/dL (calc) 3.0  DHEA-SO4 44 - 286 mcg/dL 854  eAG (mmol/L) mmol/L 6.6  Hemoglobin A1C <5.7 % of total Hgb 5.8 (H)  Testosterone  Free 0.2 - 5.0 pg/mL 9.4 (H)  TSH mIU/L 10.39 (H)  Triiodothyronine (T3) 86 - 192 ng/dL 88  U5,Qmzz(Ipmzru) 0.8 - 1.4 ng/dL 1.0  Albumin MSPROF 3.6 - 5.1 g/dL 4.3  (H): Data is abnormally high (L): Data is abnormally low   HbA1c Goals: Our ultimate goal is to achieve the lowest possible HbA1c while avoiding recurrent severe hypoglycemia.  However all HbA1c goals must be individualized per American Diabetes Association guidelines.  My Hemoglobin A1c History:  Lab Results  Component Value Date   HGBA1C 5.8 (H) 10/25/2023   HGBA1C 5.2 11/01/2021   HGBA1C 5.5 06/29/2021   HGBA1C 5.3 01/13/2021   HGBA1C 5.5 09/09/2020   HGBA1C 5.5 02/05/2020   HGBA1C 5.6 01/29/2018   HGBA1C 5.6 09/21/2017   HGBA1C 5.3 12/22/2016    My goal HbA1c is: < 5.7 %  This is equivalent to an average blood glucose of:  HbA1c % = Average BG 5.7  117      6  120   7  150     Medications: none, but have referred to see the  dietician. Recommendations for healthy eating  Never skip breakfast. Try to have at least 10 grams of protein (glass of milk, eggs, shake, or breakfast bar). No soda, juice, or sweetened drinks. Limit starches/carbohydrates to 1 fist per meal at breakfast, lunch and dinner. No eating after dinner. Eat three meals per day and dinner should be with the family. Limit of one snack daily, after school. All snacks should be a fruit or vegetables without dressing. Avoid bananas/grapes. Low carb fruits: berries, green apple, cantaloupe, honeydew No breaded or fried foods. Increase water intake, drink ice cold water 8 to 10 ounces before eating. Exercise daily for 30 to 60 minutes. Walking goal of 5000 steps per day. For insomnia or inability to stay asleep at night: Sleep App: Insomnia Coach  Meditate: Headspace on Netflix has guided meditation or Youtube Apps: Calm or Headspace have guided meditation  Thyroid:    Medication: increase to levothyroxine  137mcg daily   Laboratory studies:  Please obtain labs in 8 weeks. Remember to get labs done BEFORE the dose of levothyroxine , or 6 hours AFTER the dose of levothyroxine .  [facility] is in our office Monday, Tuesday, Wednesday  and Friday from 8AM-4PM, closed for lunch 12pm-1pm. On Thursday, you can go to the third floor, Pediatric Neurology office at [address] [place], [place] [address]. You do not need an appointment, as they see patients in the order they arrive.  Let the front staff know that you are here for labs, and they will help you get to the [facility].   --Also get fasting labs before the next visit-- Education: What is thyroid hormone?  Thyroid hormone is the medication prescribed by your child's doctor to treat hypothyroidism, also known as an underactive thyroid gland. The body makes 2 forms of thyroid hormone, levothyroxine  (T4) and triiodothyronine (T3). Generally, prescribed thyroid hormone comes in the form of T4, which is converted by  the body to the active form, T3. This medication is available in generic form as levothyroxine . Brand names you may encounter for this medication include Levothroid, Levoxyl , Synthroid ,  and Unithroid . This medication comes in pill form. Babies who need thyroid hormone because of hypothyroidism must be given this medication on a regular basis so that their brains will develop normally. Babies and older children also need thyroid hormone for normal growth, among other important body functions.  How should thyroid hormone be given?  For babies and small children, because there is no reliable liquid preparation, the pill should be crushed just before administration and mixed with a small volume of water, human (breast) milk, or formula. This mixture can be given to the baby or small child using a spoon, dropper, or infant syringe. The spoon, dropper, or syringe should be "washed through" with more liquid 2 more times until all the thyroid hormone has been given. Making a mixture of crushed tablets and water or formula for storage is not recommended because this preparation is not stable. Some pharmacies will prepare a compounded suspension of levothyroxine , but it is only guaranteed to be stable for a month and it is more expensive. Levothyroxine  is tasteless and should not be a  problem to give.  Older children and teens should be encouraged to swallow the pills whole or with water or to chew the pills if they cannot swallow them. In general, thyroid hormone should be given at the same time of day every day. Despite the instructions you may receive from your pharmacy, thyroid hormone does not need to be taken on an empty stomach. However, its absorption may be affected by food, so it should be taken consistently with or without food.   However, please avoid consuming the following foods or supplements with the thyroid hormone because they may prevent the medicine from being fully absorbed:   Soy protein  formulas or soy milk  Concentrated iron  Calcium supplements, aluminum hydroxide  Fiber supplements  Sucralfate  You do not need to worry about thyroid hormones interacting with other medications, as the medicine simply replaces a hormone that your child is no longer able to make. A good way to keep track of your child's doses is to get a 7-day pillbox and fill it at the beginning of the week. If one dose is missed, that dose should be taken as soon as possible. If you find out one day that the previous dose was missed, it is fine to double the dose the next day.  What are the side effects of thyroid hormone medication?  The rare side effects of thyroid hormone medication are related to overdose, or too much medication, and can include rapid heart rate, sweating, anxiety, and  tremors. If your child experiences these signs and symptoms, you should contact the physician who prescribed the medication for your child. A child will not have these problems if the thyroid hormone dose prescribed is only slightly more than is needed.  Is it OK to switch between brands of thyroid hormone medication?  Some endocrinologists believe that this may not always be a good idea. It is possible that different brands have different bioavailability of the "free" hormone; therefore, if you need to switch between name brands or switch from a name brand to generic levothyroxine , you should let your endocrinologist know so your child's thyroid functions can be checked if the endocrinologist feels it is necessary to do so. Once-daily administration and close follow-up with your endocrinologist is needed to ensure the best possible results.  Pediatric Endocrinology Fact Sheet Thyroid Hormone Administration: A Guide for Families Copyright  2018 American Academy of Pediatrics and Pediatric Endocrine Society. All rights reserved. The information contained in this publication should not be used as a substitute for the medical  care and advice of your pediatrician. There may be variations in treatment that your pediatrician may recommend based on individual facts and circumstances. Pediatric Endocrine Society/American Academy of Pediatrics  Section on Endocrinology Patient Education Committee

## 2023-10-29 NOTE — Assessment & Plan Note (Signed)
-  vitamin D <30, which increases risk of progression to diabetes -Rx provided -repeat level with next set of labs

## 2023-10-29 NOTE — Assessment & Plan Note (Addendum)
-  clinically euthyroid except for weight gain, worsening hyperlipidemia and worsening glycemic control -TSH >10, normal thyroxine -She reports compliance, so will increase levothyroxine daily with repeat TFTs in 8 weeks ad before the next visit

## 2023-10-29 NOTE — Assessment & Plan Note (Signed)
-  declined metformin -treat with lifestyle changes -POCT HbA1c in 3 months

## 2023-11-21 ENCOUNTER — Other Ambulatory Visit: Payer: Self-pay

## 2023-11-21 ENCOUNTER — Ambulatory Visit (HOSPITAL_COMMUNITY)
Admission: EM | Admit: 2023-11-21 | Discharge: 2023-11-21 | Disposition: A | Discharge status: Home / Self Care | Payer: Medicaid Other | Attending: Emergency Medicine | Admitting: Emergency Medicine

## 2023-11-21 ENCOUNTER — Encounter (HOSPITAL_COMMUNITY): Payer: Self-pay | Admitting: Emergency Medicine

## 2023-11-21 DIAGNOSIS — R051 Acute cough: Secondary | ICD-10-CM | POA: Diagnosis not present

## 2023-11-21 DIAGNOSIS — J101 Influenza due to other identified influenza virus with other respiratory manifestations: Secondary | ICD-10-CM

## 2023-11-21 LAB — POC COVID19/FLU A&B COMBO
Covid Antigen, POC: NEGATIVE
Influenza A Antigen, POC: POSITIVE — AB
Influenza B Antigen, POC: NEGATIVE

## 2023-11-21 MED ORDER — PROMETHAZINE-DM 6.25-15 MG/5ML PO SYRP: 2.5000 mL | ORAL_SOLUTION | Freq: Four times a day (QID) | ORAL | 0 refills | Status: DC | PRN

## 2023-11-21 MED ORDER — OSELTAMIVIR PHOSPHATE 75 MG PO CAPS: 75.0000 mg | ORAL_CAPSULE | Freq: Two times a day (BID) | ORAL | 0 refills | Status: DC

## 2023-11-21 MED ORDER — ACETAMINOPHEN 325 MG PO TABS
ORAL_TABLET | ORAL | Status: AC
  Filled 2023-11-21: qty 2

## 2023-11-21 MED ORDER — IBUPROFEN 800 MG PO TABS: 800.0000 mg | ORAL_TABLET | Freq: Once | ORAL | Status: DC

## 2023-11-21 MED ORDER — IBUPROFEN 600 MG PO TABS: 600.0000 mg | ORAL_TABLET | Freq: Four times a day (QID) | ORAL | 0 refills | Status: AC | PRN

## 2023-11-21 MED ORDER — ACETAMINOPHEN 325 MG PO TABS
650.0000 mg | ORAL_TABLET | Freq: Once | ORAL | Status: AC
  Administered 2023-11-21: 650 mg via ORAL

## 2023-11-21 NOTE — Discharge Instructions (Addendum)
You have the flu.  Take the Tamiflu as prescribed.  You can use the cough medicine as needed, this may cause drowsiness.  Alternate between Tylenol Motrin every 4-6 hours for fever, body aches and chills.  Your symptoms should improve over the next 5 to 7 days, if no improvement or any changes please return to clinic for re-evaluation.  Tiene gripe. Tome Tamiflu segn lo prescrito. Puede usar el medicamento para la tos segn sea necesario, ya que puede causar somnolencia. Alterne entre Tylenol y Motrin cada 4 a 6 horas para la fiebre, los dolores corporales y los escalofros. Sus sntomas deberan [employer] prximos 5 a 7 das. Si no hay mejora o hay algn cambio, regrese a la clnica para una nueva evaluacin.

## 2023-11-21 NOTE — ED Provider Notes (Signed)
[facility]    CSN: [id number] Arrival date & time: 11/21/23  4098      History   Chief Complaint Chief Complaint  Patient presents with   Cough    HPI Rebecca [name] is a 18 y.o. female.   Patient presents to clinic, brought in with mother.  Complaints of a cough and rhinorrhea for the past 2 weeks.  Suddenly last night she developed headache, body aches, sore throat and a fever.  She has been taking Tylenol for the symptoms, last dose around 4 AM this morning.  Both of her sisters tested positive for the flu recently.  Has not had any abdominal pain, nausea, vomiting or diarrhea. No SOB or wheezing.   Patient is able to speak [place] and declined a language interpreter.  The history is provided by the patient and medical records.  Cough   Past Medical History:  Diagnosis Date   Allergy    Autoimmune hypothyroidism 2017   Metabolic syndrome 04/27/2023   PCOS (polycystic ovarian syndrome) 10/27/2022   PCOS diagnosed as she had elevated testosterone with irregular menses, acne and hirsutism. She has decided to not treat with medication.    Vision abnormalities    Vitamin D deficiency 06/14/2016    Patient Active Problem List   Diagnosis Date Noted   Prediabetes 10/29/2023   Metabolic syndrome 04/27/2023   PCOS (polycystic ovarian syndrome) 10/27/2022   Irregular menstrual cycle 09/08/2022   Low HDL (under 40) 09/08/2022   Hypertriglyceridemia 09/08/2022   Counseling for transition from pediatric to adult care provider 09/08/2022   Class 2 severe obesity due to excess calories with serious comorbidity and body mass index (BMI) of 39.0 to 39.9 in adult (HCC) 09/08/2022   Acanthosis nigricans, acquired 06/14/2016   Hypothyroidism, acquired, 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 No pertinent surgical history.  OB History   No obstetric history on file.      Home  Medications    Prior to Admission medications   Medication Sig Start Date End Date Taking? Authorizing Provider  ibuprofen (ADVIL) 600 MG tablet Take 1 tablet (600 mg total) by mouth every 6 (six) hours as needed. 11/21/23  Yes Rinaldo Ratel, [place] N, FNP  oseltamivir (TAMIFLU) 75 MG capsule Take 1 capsule (75 mg total) by mouth every 12 (twelve) hours. 11/21/23  Yes Rinaldo Ratel, [place], FNP  promethazine-dextromethorphan (PROMETHAZINE-DM) 6.25-15 MG/5ML syrup Take 2.5 mLs by mouth 4 (four) times daily as needed for cough. 11/21/23  Yes Rinaldo Ratel, [place] N, FNP  cholecalciferol (VITAMIN D3) 25 MCG (1000 UNIT) tablet Take 1,000 Units by mouth daily.    [provider]  ergocalciferol (VITAMIN D2) 1.25 MG (50000 UT) capsule Take 1 capsule (50,000 Units total) by mouth once a week for 8 doses. 10/29/23 12/24/23  Silvana Newness, MD  levothyroxine (SYNTHROID) 137 MCG tablet Take 1 tablet (137 mcg total) by mouth daily. 10/29/23   Silvana Newness, MD    Family History History reviewed. No pertinent family history.  Social History Social History   Tobacco Use   Smoking status: Never    Passive exposure: Never   Smokeless tobacco: Never  Vaping Use   Vaping status: Never Used  Substance Use Topics   Alcohol use: No   Drug use: No     Allergies   Milk-related compounds and Shellfish allergy   Review of Systems Review of Systems  Per HPI   Physical Exam Triage  Vital Signs ED Triage Vitals  Encounter Vitals Group     BP 11/21/23 1201 138/81     Systolic BP Percentile --      Diastolic BP Percentile --      Pulse Rate 11/21/23 1201 (!) 106     Resp 11/21/23 1201 20     Temp 11/21/23 1201 (!) 102.6 F (39.2 C)     Temp Source 11/21/23 1201 Oral     SpO2 11/21/23 1201 97 %     Weight --      Height --      Head Circumference --      Peak Flow --      Pain Score 11/21/23 1158 7     Pain Loc --      Pain Education --      Exclude from Growth Chart --    No data  found.  Updated Vital Signs BP 138/81 (BP Location: Right Arm) Comment (BP Location): large cuff  Pulse (!) 106   Temp (!) 102.6 F (39.2 C) (Oral)   Resp 20   LMP 03/07/2023   SpO2 97%   Visual Acuity Right Eye Distance:   Left Eye Distance:   Bilateral Distance:    Right Eye Near:   Left Eye Near:    Bilateral Near:     Physical Exam Vitals and nursing note reviewed.  Constitutional:      Appearance: Normal appearance.  HENT:     Head: Normocephalic and atraumatic.     Right Ear: External ear normal.     Left Ear: External ear normal.     Nose: Congestion and rhinorrhea present.     Mouth/Throat:     Mouth: Mucous membranes are moist.     Pharynx: Posterior oropharyngeal erythema present.  Eyes:     Conjunctiva/sclera: Conjunctivae normal.  Cardiovascular:     Rate and Rhythm: Normal rate and regular rhythm.  Pulmonary:     Effort: Pulmonary effort is normal. No respiratory distress.     Breath sounds: Normal breath sounds.  Musculoskeletal:        General: Normal range of motion.  Skin:    General: Skin is warm and dry.  Neurological:     General: No focal deficit present.     Mental Status: She is alert and oriented to person, place, and time.  Psychiatric:        Mood and Affect: Mood normal.        Behavior: Behavior normal.      UC Treatments / Results  Labs (all labs ordered are listed, but only abnormal results are displayed) Labs Reviewed  POC COVID19/FLU A&B COMBO - Abnormal; Notable for the following components:      Result Value   Influenza A Antigen, POC Positive (*)    All other components within normal limits    EKG   Radiology No results found.  Procedures Procedures (including critical care time)  Medications Ordered in UC Medications  acetaminophen (TYLENOL) tablet 650 mg (650 mg Oral Given 11/21/23 1210)    Initial Impression / Assessment and Plan / UC Course  I have reviewed the triage vital signs and the nursing  notes.  Pertinent labs & imaging results that were available during my care of the patient were reviewed by me and considered in my medical decision making (see chart for details).  Vitals and triage reviewed, patient is hemodynamically stable.  Lungs are vesicular, heart with regular rate and rhythm.  Febrile  and tachycardic initially, given Tylenol in clinic.  Congestion, rhinorrhea and postnasal drip present.  POC testing positive for influenza A.  Fever, body aches and chills started last night, will start Tamiflu.  Symptomatic management for viral illnesses discussed.  Plan of care, follow-up care return precautions given, no questions at this time.  School note provided for patient, work note provided for mother.     Final Clinical Impressions(s) / UC Diagnoses   Final diagnoses:  Influenza A  Acute cough     Discharge Instructions      You have the flu.  Take the Tamiflu as prescribed.  You can use the cough medicine as needed, this may cause drowsiness.  Alternate between Tylenol Motrin every 4-6 hours for fever, body aches and chills.  Your symptoms should improve over the next 5 to 7 days, if no improvement or any changes please return to clinic for re-evaluation.  Tiene gripe. Tome Tamiflu segn lo prescrito. Puede usar el medicamento para la tos segn sea necesario, ya que puede causar somnolencia. Alterne entre Tylenol y Motrin cada 4 a 6 horas para la fiebre, los dolores corporales y los escalofros. Sus sntomas deberan [employer] prximos 5 a 7 das. Si no hay mejora o hay algn cambio, regrese a la clnica para una nueva evaluacin.   ED Prescriptions     Medication Sig Dispense Auth. Provider   oseltamivir (TAMIFLU) 75 MG capsule Take 1 capsule (75 mg total) by mouth every 12 (twelve) hours. 10 capsule Rinaldo Ratel, [place] N, [place]   ibuprofen (ADVIL) 600 MG tablet Take 1 tablet (600 mg total) by mouth every 6 (six) hours as needed. 30 tablet Rinaldo Ratel, [place], [place]    promethazine-dextromethorphan (PROMETHAZINE-DM) 6.25-15 MG/5ML syrup Take 2.5 mLs by mouth 4 (four) times daily as needed for cough. 118 mL Eugine Bubb, [place] N, [place]      PDMP not reviewed this encounter.   Dash Cardarelli, [place] N, [place] 11/21/23 1233

## 2023-11-21 NOTE — ED Triage Notes (Signed)
Cough and runny nose for 2 weeks.  Now has fever, headache, and body aches.  Patient did not measure temperature.    Sisters living with patient have tested positive for flu.    Has had tylenol at 4 am

## 2023-11-29 ENCOUNTER — Other Ambulatory Visit (HOSPITAL_COMMUNITY): Payer: Self-pay

## 2024-01-02 ENCOUNTER — Other Ambulatory Visit (HOSPITAL_COMMUNITY): Payer: Self-pay

## 2024-02-05 ENCOUNTER — Other Ambulatory Visit (HOSPITAL_COMMUNITY): Payer: Self-pay

## 2024-03-05 ENCOUNTER — Other Ambulatory Visit (HOSPITAL_COMMUNITY): Payer: Self-pay

## 2024-04-16 ENCOUNTER — Other Ambulatory Visit (HOSPITAL_COMMUNITY): Payer: Self-pay

## 2024-04-23 LAB — T4, FREE: Free T4: 1.2 ng/dL (ref 0.8–1.4)

## 2024-04-23 LAB — TSH: TSH: 2.04 m[IU]/L

## 2024-04-23 LAB — VITAMIN D 25 HYDROXY (VIT D DEFICIENCY, FRACTURES): Vit D, 25-Hydroxy: 44 ng/mL (ref 30–100)

## 2024-04-23 NOTE — Progress Notes (Signed)
 Pediatric Endocrinology Consultation Follow-up Visit Rebecca [name] [birth year] [phone] Tess Barns, NP (Inactive)   HPI: Rebecca [name]  is a 18 y.o. female presenting for follow-up of Metabolic syndrome and Prediabetes.  she is accompanied to this visit by her mother. Interpreter present throughout the visit: Yes Spanish.  Rebecca [name] was last seen at [facility] on 10/29/2023.  Since last visit, adult endo called to schedule appt in Feb, but referral closed due to no answer. She has been eating less sugar and gave it up for [place]. She also enrolled in the gym. Rebecca [name] has been taking levothyroxine  137mcg with missed doses during traveling (1 month ago). There has been no heat/cold intolerance, constipation/diarrhea, rapid heart rate, tremor, mood changes, poor energy, fatigue, dry skin, brittle hair/hair loss, nor changes in menses.   ROS: Greater than 10 systems reviewed with pertinent positives listed in HPI, otherwise neg. The following portions of the patient's history were reviewed and updated as appropriate:  Past Medical History:  has a past medical history of Allergy, Autoimmune hypothyroidism (2017), Metabolic syndrome (04/27/2023), PCOS (polycystic ovarian syndrome) (10/27/2022), Vision abnormalities, and Vitamin D  deficiency (06/14/2016).  Meds: Current Outpatient Medications  Medication Instructions   cholecalciferol (VITAMIN D3) 1,000 Units, Daily   ibuprofen  (ADVIL ) 600 mg, Oral, Every 6 hours PRN   levothyroxine  (SYNTHROID ) 137 mcg, Oral, Daily   oseltamivir  (TAMIFLU ) 75 mg, Oral, Every 12 hours   promethazine -dextromethorphan (PROMETHAZINE -DM) 6.25-15 MG/5ML syrup 2.5 mLs, Oral, 4 times daily PRN    Allergies: Allergies  Allergen Reactions   Milk-Related Compounds    Shellfish Allergy     All seafood    Surgical History: History reviewed. No pertinent surgical history.  Family History: family history is not on file.  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     reports that she has never smoked. She has never been exposed to tobacco smoke. She has never used smokeless tobacco. She reports that she does not drink alcohol and does not use drugs.  Physical Exam:  Vitals:   04/28/24 0950  BP: 122/70  Pulse: 90  Weight: 225 lb 6.4 oz (102.2 kg)  Height: 5' 0.87 (1.546 m)   BP 122/70 (Cuff Size: Normal)   Pulse 90   Ht 5' 0.87 (1.546 m)   Wt 225 lb 6.4 oz (102.2 kg)   BMI 42.78 kg/m  Body mass index: body mass index is 42.78 kg/m. Blood pressure %iles are not available for patients who are 18 years or older. >99 %ile (Z= 2.43, 135% of 95%ile) based on CDC (Girls, 2-20 Years) BMI-for-age based on BMI available on 04/28/2024.  Wt Readings from Last 3 Encounters:  04/28/24 225 lb 6.4 oz (102.2 kg) (99%, Z= 2.27)*  10/29/23 223 lb 6.4 oz (101.3 kg) (99%, Z= 2.24)*  04/27/23 213 lb (96.6 kg) (98%, Z= 2.12)*   * Growth percentiles are based on CDC (Girls, 2-20 Years) data.   Ht Readings from Last 3 Encounters:  04/28/24 5' 0.87 (1.546 m) (9%, Z= -1.34)*  10/27/22 5' 0.71 (1.542 m) (8%, Z= -1.38)*  09/08/22 5' 0.87 (1.546 m) (9%, Z= -1.32)*   * Growth percentiles are based on CDC (Girls, 2-20 Years) data.   Physical Exam Vitals reviewed.  Constitutional:      Appearance: Normal appearance. She is not toxic-appearing.  HENT:     Head: Normocephalic and atraumatic.  Nose: Nose normal.     Mouth/Throat:     Mouth: Mucous membranes are moist.  Eyes:     Extraocular Movements: Extraocular movements intact.     Comments: glasses  Neck:     Comments: NO Goiter, no nodules Pulmonary:     Effort: Pulmonary effort is normal. No respiratory distress.  Chest:     Chest wall: Tenderness: er.  Abdominal:     General: There is no distension.  Musculoskeletal:        General: Normal range of motion.     Cervical back:  Normal range of motion and neck supple.  Skin:    General: Skin is warm.     Capillary Refill: Capillary refill takes less than 2 seconds.     Comments: Mild acanthosis  Neurological:     General: No focal deficit present.     Mental Status: She is alert.     Gait: Gait normal.  Psychiatric:        Mood and Affect: Mood normal.        Behavior: Behavior normal.      Labs: Results for orders placed or performed in visit on 04/28/24  POCT Glucose (Device for Home Use)   Collection Time: 04/28/24 10:00 AM  Result Value Ref Range   Glucose Fasting, POC     POC Glucose 97 70 - 99 mg/dl  POCT glycosylated hemoglobin (Hb A1C)   Collection Time: 04/28/24 10:01 AM  Result Value Ref Range   Hemoglobin A1C 5.5 4.0 - 5.6 %   HbA1c POC (<> result, manual entry)     HbA1c, POC (prediabetic range)     HbA1c, POC (controlled diabetic range)      Imaging: No results found for this or any previous visit.   Assessment/Plan: Hypothyroidism, acquired, autoimmune Overview: Autoimmune hypothyroidism (TPO Ab >900, TH Ab >1000) treated with levothyroxine . she established care with [facility] End[facility]inology 09/19/2016 with Dr. Hershal and transitioned care to me 09/07/2022.  Assessment & Plan: -clinically euthyroid  -normal TSH -normal thyroxine -continue levothyroxine  137mcg daily  -TSH and Free T4 before the next visit -referred again to adult endo  Orders: -     Ambulatory referral to Endocrinology -     Levothyroxine  Sodium; Take 1 tablet (137 mcg total) by mouth daily.  Dispense: 30 tablet; Refill: 5 -     T4, free -     TSH  Metabolic syndrome Overview: Metabolic syndrome diagnosed as she now has prediabetes (10/29/2023) with history of acanthosis on exam, PCOS, low HDL, elevated triglycerides and low vitamin D  level. She has seen dietician in the past.   Orders: -     COLLECTION CAPILLARY BLOOD SPECIMEN -     POCT Glucose (Device for Home Use) -      POCT glycosylated hemoglobin (Hb A1C) -     Ambulatory referral to Endocrinology -     Levothyroxine  Sodium; Take 1 tablet (137 mcg total) by mouth daily.  Dispense: 30 tablet; Refill: 5  PCOS (polycystic ovarian syndrome) Overview: PCOS diagnosed as she had elevated testosterone  with irregular menses, acne and hirsutism. She has decided to not treat with medication.   Assessment & Plan: -menses occuring -No tx desired  Orders: -     Ambulatory referral to Endocrinology -     Levothyroxine  Sodium; Take 1 tablet (137 mcg total) by mouth daily.  Dispense: 30 tablet; Refill: 5  Prediabetes Overview: HbA1c 5.8% January 2025.  Assessment & Plan: -  normal A1c -normal glucose -continue treat with lifestyle changes   Orders: -     COLLECTION CAPILLARY BLOOD SPECIMEN -     POCT Glucose (Device for Home Use) -     POCT glycosylated hemoglobin (Hb A1C) -     Ambulatory referral to Endocrinology  Counseling for transition from pediatric to adult care provider -     Ambulatory referral to Endocrinology -     Levothyroxine  Sodium; Take 1 tablet (137 mcg total) by mouth daily.  Dispense: 30 tablet; Refill: 5  Class 3 severe obesity due to excess calories without serious comorbidity with body mass index (BMI) of 40.0 to 44.9 in adult Assessment & Plan: Previously referred, making healthy changes     Patient Instructions  HbA1c Goals: Our ultimate goal is to achieve the lowest possible HbA1c while avoiding recurrent severe hypoglycemia.  However all HbA1c goals must be individualized per American Diabetes Association guidelines.  My Hemoglobin A1c History:  Lab Results  Component Value Date   HGBA1C 5.5 04/28/2024   HGBA1C 5.8 (H) 10/25/2023   HGBA1C 5.2 11/01/2021   HGBA1C 5.5 06/29/2021   HGBA1C 5.3 01/13/2021   HGBA1C 5.5 09/09/2020   HGBA1C 5.6 01/29/2018   HGBA1C 5.6 09/21/2017   HGBA1C 5.3 12/22/2016    Latest Reference Range & Units 04/23/24 10:12  TSH mIU/L 2.04   T4,Free(Direct) 0.8 - 1.4 ng/dL 1.2    My goal YaJ8r is: < 5.7 %  This is equivalent to an average blood glucose of:  HbA1c % = Average BG 5.7  117      6  120   7  150     Medication: continue levothyroxine  137mcg daily  Laboratory studies:  Please obtain labs 1-2 days before the next visit. Remember to get labs done BEFORE the dose of levothyroxine , or 6 hours AFTER the dose of levothyroxine .  [facility] is in our office Monday, Tuesday, Wednesday and Friday from 8AM-4PM, closed for lunch 12pm-1pm. On Thursday, you can go to the third floor, Pediatric Neurology office at [address], [place], [place] [address]. You do not need an appointment, as they see patients in the order they arrive.  Let the front staff know that you are here for labs, and they will help you get to the [facility].     Follow-up:   Return in about 6 months (around 10/29/2024) for to assess growth and development, follow up.  Medical decision-making:  I have personally spent 40 minutes involved in face-to-face and non-face-to-face activities for this patient on the day of the visit. Professional time spent includes the following activities, in addition to those noted in the documentation: preparation time/chart review, ordering of medications/tests/procedures, obtaining and/or reviewing separately obtained history, counseling and educating the patient/family/caregiver, performing a medically appropriate examination and/or evaluation, referring and communicating with other health care professionals for care coordination, and documentation in the EHR.  Thank you for the opportunity to participate in the care of your patient. Please do not hesitate to contact me should you have any questions regarding the assessment or treatment plan.   Sincerely,   Marce Rucks, MD

## 2024-04-28 ENCOUNTER — Ambulatory Visit (INDEPENDENT_AMBULATORY_CARE_PROVIDER_SITE_OTHER): Payer: Medicaid Other | Admitting: Pediatrics

## 2024-04-28 ENCOUNTER — Other Ambulatory Visit (HOSPITAL_COMMUNITY): Payer: Self-pay

## 2024-04-28 ENCOUNTER — Encounter (INDEPENDENT_AMBULATORY_CARE_PROVIDER_SITE_OTHER): Payer: Self-pay | Admitting: Pediatrics

## 2024-04-28 VITALS — BP 122/70 | HR 90 | Ht 60.87 in | Wt 225.4 lb

## 2024-04-28 DIAGNOSIS — E282 Polycystic ovarian syndrome: Secondary | ICD-10-CM | POA: Diagnosis not present

## 2024-04-28 DIAGNOSIS — Z7187 Encounter for pediatric-to-adult transition counseling: Secondary | ICD-10-CM

## 2024-04-28 DIAGNOSIS — Z6841 Body Mass Index (BMI) 40.0 and over, adult: Secondary | ICD-10-CM

## 2024-04-28 DIAGNOSIS — E66813 Obesity, class 3: Secondary | ICD-10-CM

## 2024-04-28 DIAGNOSIS — E8881 Metabolic syndrome: Secondary | ICD-10-CM | POA: Diagnosis not present

## 2024-04-28 DIAGNOSIS — E063 Autoimmune thyroiditis: Secondary | ICD-10-CM

## 2024-04-28 DIAGNOSIS — R7303 Prediabetes: Secondary | ICD-10-CM

## 2024-04-28 LAB — POCT GLYCOSYLATED HEMOGLOBIN (HGB A1C): Hemoglobin A1C: 5.5 % (ref 4.0–5.6)

## 2024-04-28 LAB — POCT GLUCOSE (DEVICE FOR HOME USE): POC Glucose: 97 mg/dL (ref 70–99)

## 2024-04-28 MED ORDER — LEVOTHYROXINE SODIUM 137 MCG PO TABS
137.0000 ug | ORAL_TABLET | Freq: Every day | ORAL | 5 refills | Status: DC
  Filled 2024-04-28 – 2024-05-14 (×2): qty 30, 30d supply, fill #0
  Filled 2024-06-18: qty 30, 30d supply, fill #1
  Filled 2024-07-25: qty 30, 30d supply, fill #2
  Filled 2024-08-27: qty 30, 30d supply, fill #3

## 2024-04-28 NOTE — Assessment & Plan Note (Signed)
-  menses occuring -No tx desired

## 2024-04-28 NOTE — Assessment & Plan Note (Signed)
 Previously referred, making healthy changes

## 2024-04-28 NOTE — Assessment & Plan Note (Signed)
-  clinically euthyroid  -normal TSH -normal thyroxine -continue levothyroxine  137mcg daily  -TSH and Free T4 before the next visit -referred again to adult endo

## 2024-04-28 NOTE — Assessment & Plan Note (Signed)
-  normal A1c -normal glucose -continue treat with lifestyle changes

## 2024-04-28 NOTE — Patient Instructions (Signed)
 HbA1c Goals: Our ultimate goal is to achieve the lowest possible HbA1c while avoiding recurrent severe hypoglycemia.  However all HbA1c goals must be individualized per American Diabetes Association guidelines.  My Hemoglobin A1c History:  Lab Results  Component Value Date   HGBA1C 5.5 04/28/2024   HGBA1C 5.8 (H) 10/25/2023   HGBA1C 5.2 11/01/2021   HGBA1C 5.5 06/29/2021   HGBA1C 5.3 01/13/2021   HGBA1C 5.5 09/09/2020   HGBA1C 5.6 01/29/2018   HGBA1C 5.6 09/21/2017   HGBA1C 5.3 12/22/2016    Latest Reference Range & Units 04/23/24 10:12  TSH mIU/L 2.04  T4,Free(Direct) 0.8 - 1.4 ng/dL 1.2    My goal YaJ8r is: < 5.7 %  This is equivalent to an average blood glucose of:  HbA1c % = Average BG 5.7  117      6  120   7  150     Medication: continue levothyroxine  137mcg daily  Laboratory studies:  Please obtain labs 1-2 days before the next visit. Remember to get labs done BEFORE the dose of levothyroxine , or 6 hours AFTER the dose of levothyroxine .  [facility] is in our office Monday, Tuesday, Wednesday and Friday from 8AM-4PM, closed for lunch 12pm-1pm. On Thursday, you can go to the third floor, Pediatric Neurology office at [address], [place], [place] [address]. You do not need an appointment, as they see patients in the order they arrive.  Let the front staff know that you are here for labs, and they will help you get to the [facility].

## 2024-05-14 ENCOUNTER — Other Ambulatory Visit (HOSPITAL_COMMUNITY): Payer: Self-pay

## 2024-06-18 ENCOUNTER — Other Ambulatory Visit (HOSPITAL_COMMUNITY): Payer: Self-pay

## 2024-07-25 ENCOUNTER — Other Ambulatory Visit (HOSPITAL_COMMUNITY): Payer: Self-pay

## 2024-08-27 ENCOUNTER — Other Ambulatory Visit (HOSPITAL_COMMUNITY): Payer: Self-pay

## 2024-09-06 LAB — TSH: TSH: 1.26 m[IU]/L

## 2024-09-06 LAB — T4, FREE: Free T4: 1.2 ng/dL (ref 0.8–1.4)

## 2024-09-08 ENCOUNTER — Ambulatory Visit (INDEPENDENT_AMBULATORY_CARE_PROVIDER_SITE_OTHER): Payer: Self-pay | Admitting: Pediatrics

## 2024-09-08 NOTE — Progress Notes (Signed)
 Normal TFTs and seeing Dr. Mercie 09/10/2024.

## 2024-09-10 ENCOUNTER — Ambulatory Visit (INDEPENDENT_AMBULATORY_CARE_PROVIDER_SITE_OTHER): Admitting: Endocrinology

## 2024-09-10 ENCOUNTER — Other Ambulatory Visit (HOSPITAL_COMMUNITY): Payer: Self-pay

## 2024-09-10 ENCOUNTER — Encounter: Payer: Self-pay | Admitting: Endocrinology

## 2024-09-10 VITALS — BP 102/60 | HR 74 | Resp 16 | Ht 60.0 in | Wt 223.2 lb

## 2024-09-10 DIAGNOSIS — E282 Polycystic ovarian syndrome: Secondary | ICD-10-CM

## 2024-09-10 DIAGNOSIS — Z7187 Encounter for pediatric-to-adult transition counseling: Secondary | ICD-10-CM

## 2024-09-10 DIAGNOSIS — E559 Vitamin D deficiency, unspecified: Secondary | ICD-10-CM | POA: Diagnosis not present

## 2024-09-10 DIAGNOSIS — E063 Autoimmune thyroiditis: Secondary | ICD-10-CM

## 2024-09-10 DIAGNOSIS — E8881 Metabolic syndrome: Secondary | ICD-10-CM

## 2024-09-10 MED ORDER — LEVOTHYROXINE SODIUM 137 MCG PO TABS
137.0000 ug | ORAL_TABLET | Freq: Every day | ORAL | 3 refills | Status: DC
  Filled 2024-09-10: qty 90, 90d supply, fill #0

## 2024-09-10 MED ORDER — LEVOTHYROXINE SODIUM 137 MCG PO TABS: 137.0000 ug | ORAL_TABLET | Freq: Every day | ORAL | 3 refills | Status: AC

## 2024-09-10 NOTE — Progress Notes (Unsigned)
 Outpatient Endocrinology Note Rebecca Waide, MD   Patient's Name: Rebecca [name]    DOB: 08-05-2006    MRN: [id number]  REASON OF VISIT: New consult for hypothyroidism  REFERRING PROVIDER: Margarete Golds, MD  PCP: Tess Barns, NP (Inactive)  HISTORY OF PRESENT ILLNESS:   Rebecca [name] is a 18 y.o. old female with past medical history as listed below is presented for new consult for hypothyroidism.   Pertinent Thyroid History: Patient is transitioning care from pediatric endocrinology to adult endocrinology, initial visit on September 11, 2024.  Patient was diagnosed with primary hypothyroidism in August 2017, she has been on thyroid hormone replacement since the diagnosis.  Levothyroxine  dose was adjusted periodically in the past, lately has been taking 137 mcg daily since January 2025.  Patient had elevated thyroglobulin and thyroid peroxidase antibody consistent with having Hashimoto thyroiditis to cause hypothyroidism.   Latest Reference Range & Units 06/13/16 00:01  Thyroglobulin Ab <2 IU/mL >1000 (H)  Thyroperoxidase Ab SerPl-aCnc <9 IU/mL >900 (H)  (H): Data is abnormally high  No family history of thyroid disorder.  Patient has PCOS, had elevated free testosterone  in November 2023 and January 2025.  Patient has prediabetes with hemoglobin A1c of 5.8% in January 2025, improved to 5.5% in July 2025.  On lifestyle modification.  Vitamin D  deficiency : Had vitamin D  of 21 in January 2025 and normalized and a vitamin D  of 44 in July 2025 on vitamin D  supplement.   Interval history Initial visit today.  Patient is transitioning care from pediatric to adult endocrinology.  She is currently taking levothyroxine  137 mcg daily, taking in the early morning and empty stomach and reports compliance.  No hypo and hyperthyroid symptoms.  She had normal thyroid function test as follows few days ago.   Latest Reference Range & Units 09/05/24 10:24  TSH mIU/L 1.26   T4,Free(Direct) 0.8 - 1.4 ng/dL 1.2   REVIEW OF SYSTEMS:  As per history of present illness.   PAST MEDICAL HISTORY: Past Medical History:  Diagnosis Date   Allergy    Autoimmune hypothyroidism 2017   Metabolic syndrome 04/27/2023   PCOS (polycystic ovarian syndrome) 10/27/2022   PCOS diagnosed as she had elevated testosterone  with irregular menses, acne and hirsutism. She has decided to not treat with medication.    Vision abnormalities    Vitamin D  deficiency 06/14/2016    PAST SURGICAL HISTORY: History reviewed. No pertinent surgical history.  ALLERGIES: Allergies  Allergen Reactions   Milk-Related Compounds    Shellfish Allergy     All seafood    FAMILY HISTORY:  History reviewed. No pertinent family history.  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   Social Drivers of Corporate Investment Banker Strain:  Not on File (02/09/2022)   Received from [employer] [facility] Strain: 0  Food Insecurity: Not on File (07/19/2023)   Received from [employer]    Food: 0  Transportation Needs: Not on File (02/09/2022)   Received from [employer] [facility]eds    Transportation: 0  Physical Activity: Not on File (02/09/2022)   Received from [facility]   Physical Activity    Physical Activity: 0  Stress: Not on File (02/09/2022)    Received from [facility]   Stress    Stress: 0  Social Connections: Not on File (07/07/2023)   Received from [employer]   Social Connections    Connectedness: 0    MEDICATIONS:  Current Outpatient Medications  Medication Sig Dispense Refill   cholecalciferol (VITAMIN D3) 25 MCG (1000 UNIT) tablet Take 1,000 Units by mouth daily.     ibuprofen  (ADVIL ) 600 MG tablet Take 1 tablet (600 mg total) by mouth every 6 (six) hours as needed. 30 tablet 0   levothyroxine  (SYNTHROID ) 137 MCG tablet Take 1 tablet (137 mcg total) by mouth daily. 90 tablet 3   No current facility-administered medications for this visit.    PHYSICAL EXAM: Vitals:   09/10/24 1440  BP: 102/60  Pulse: 74  Resp: 16  SpO2: 99%  Weight: 223 lb 3.2 oz (101.2 kg)  Height: 5' (1.524 m)   Body mass index is 43.59 kg/m.  Wt Readings from Last 3 Encounters:  09/10/24 223 lb 3.2 oz (101.2 kg)  04/28/24 225 lb 6.4 oz (102.2 kg) (99%, Z= 2.27)*  10/29/23 223 lb 6.4 oz (101.3 kg) (99%, Z= 2.24)*   * Growth percentiles are based on CDC (Girls, 2-20 Years) data.    General: Well developed, well nourished female in no apparent distress.  HEENT: AT/[place], no external lesions. Hearing intact to the spoken word Eyes: EOMI. No stare, proptosis or lid lag. Conjunctiva clear and no icterus. Neck: Trachea midline, neck supple without appreciable thyromegaly or lymphadenopathy and no palpable thyroid nodules Lungs: Clear to auscultation, no wheeze. Respirations not labored Heart: S1S2, Regular in rate and rhythm. No loud murmurs Abdomen: Soft, non tender, non distended Neurologic: Alert, oriented, normal speech, deep tendon biceps reflexes normal,  no gross focal neurological deficit Extremities: No pedal pitting edema, no tremors of outstretched hands Skin: Warm, color good.  Psychiatric: Does not appear depressed or anxious  PERTINENT HISTORIC LABORATORY AND IMAGING STUDIES:  All pertinent laboratory results were reviewed. Please see  HPI also for further details.   TSH  Date Value Ref Range Status  09/05/2024 1.26 mIU/L Final    Comment:              Reference Range .           > or = 20 Years  0.40-4.50 .                Pregnancy Ranges           First trimester    0.26-2.66           Second trimester   0.55-2.73           Third trimester    0.43-2.91   04/23/2024 2.04 mIU/L Final    Comment:               Reference Range .            1-19 Years 0.50-4.30 .  Pregnancy Ranges            First trimester   0.26-2.66            Second trimester  0.55-2.73            Third trimester   0.43-2.91   10/25/2023 10.39 (H) mIU/L Final    Comment:               Reference Range .            1-19 Years 0.50-4.30 .                Pregnancy Ranges            First trimester   0.26-2.66            Second trimester  0.55-2.73            Third trimester   0.43-2.91    T3, Total  Date Value Ref Range Status  10/25/2023 88 86 - 192 ng/dL Final     ASSESSMENT / PLAN  1. Hypothyroidism due to Hashimoto thyroiditis   2. Hypothyroidism, acquired, autoimmune   3. Metabolic syndrome   4. PCOS (polycystic ovarian syndrome)   5. Vitamin D  deficiency     Patient has primary hypothyroidism due to Hashimoto thyroiditis, diagnosed in 2017.  She is currently taking levothyroxine  137 mcg daily.  She is clinically euthyroid.  She had normal thyroid function test recently.  We discussed the medical need for compliance with levothyroxine  therapy, that it is a hormone necessary for life, and that serious consequences may result from noncompliance. Discussed the proper method of levothyroxine  administration: take on an empty stomach in the morning, with water, waiting thirty to sixty minutes before taking any other beverages or food. Also reviewed the need to take calcium or iron supplements or multivitamin (that may contain iron or calcium) at least 4 hours after levothyroxine  administration.  Discussed that  Hashimoto thyroiditis is an autoimmune thyroid disorder and causing hypothyroidism.  No separate treatment for Hashimoto thyroiditis is required.  Patient has PCOS.  She has prediabetes, on lifestyle modification.  Patient has vitamin D  deficiency and taking vitamin D  supplement, had normal vitamin D  level in July 2025.  Plan: - Continue levothyroxine  137 mcg daily. - Continue current dose of vitamin D  supplement and stay on lifestyle modification for prediabetes. - Patient has planned to travel to out of country for 5 months during his spring timeframe.  Paper prescription provided just in case if she need to get levothyroxine  in the foreign country. - Patient will talk with pharmacy as well if she will be able to get levothyroxine  for 5 months when she is traveling to overseas.  Diagnoses and all orders for this visit:  Hypothyroidism due to Hashimoto thyroiditis -     Discontinue: levothyroxine  (SYNTHROID ) 137 MCG tablet; Take 1 tablet (137 mcg total) by mouth daily. -     levothyroxine  (SYNTHROID ) 137 MCG tablet; Take 1 tablet (137 mcg total) by mouth daily.  Hypothyroidism, acquired, autoimmune -     Discontinue: levothyroxine  (SYNTHROID ) 137 MCG tablet; Take 1 tablet (137 mcg total) by mouth daily. -     levothyroxine  (SYNTHROID ) 137 MCG tablet; Take 1 tablet (137 mcg total) by mouth daily.  Metabolic syndrome -     Discontinue: levothyroxine  (SYNTHROID ) 137 MCG tablet; Take 1 tablet (137 mcg total) by mouth daily. -     levothyroxine  (SYNTHROID ) 137 MCG tablet; Take 1 tablet (137 mcg  total) by mouth daily.  PCOS (polycystic ovarian syndrome) -     Discontinue: levothyroxine  (SYNTHROID ) 137 MCG tablet; Take 1 tablet (137 mcg total) by mouth daily. -     levothyroxine  (SYNTHROID ) 137 MCG tablet; Take 1 tablet (137 mcg total) by mouth daily.  Vitamin D  deficiency    DISPOSITION Follow up in clinic in 9 months suggested.  Labs prior to follow-up visit.  All questions answered  and patient verbalized understanding of the plan.  Rebecca Broda, MD [facility] Endocrinology [facility] [address] [place], [address] [place], [place] [address] Phone # [phone]  At least part of this note was generated using voice recognition software. Inadvertent word errors may have occurred, which were not recognized during the proofreading process.

## 2024-09-26 ENCOUNTER — Other Ambulatory Visit: Payer: Self-pay

## 2024-09-26 ENCOUNTER — Other Ambulatory Visit (HOSPITAL_COMMUNITY): Payer: Self-pay

## 2024-09-26 ENCOUNTER — Other Ambulatory Visit (INDEPENDENT_AMBULATORY_CARE_PROVIDER_SITE_OTHER): Payer: Self-pay | Admitting: Endocrinology

## 2024-09-26 DIAGNOSIS — E282 Polycystic ovarian syndrome: Secondary | ICD-10-CM

## 2024-09-26 DIAGNOSIS — E8881 Metabolic syndrome: Secondary | ICD-10-CM

## 2024-09-26 DIAGNOSIS — Z7187 Encounter for pediatric-to-adult transition counseling: Secondary | ICD-10-CM

## 2024-09-26 DIAGNOSIS — E063 Autoimmune thyroiditis: Secondary | ICD-10-CM

## 2024-09-26 MED ORDER — LEVOTHYROXINE SODIUM 137 MCG PO TABS
137.0000 ug | ORAL_TABLET | Freq: Every day | ORAL | 3 refills | Status: AC
  Filled 2024-09-26: qty 90, 90d supply, fill #0

## 2024-10-06 ENCOUNTER — Other Ambulatory Visit (HOSPITAL_COMMUNITY): Payer: Self-pay

## 2024-11-03 ENCOUNTER — Ambulatory Visit (INDEPENDENT_AMBULATORY_CARE_PROVIDER_SITE_OTHER): Admitting: Pediatrics

## 2025-06-03 ENCOUNTER — Other Ambulatory Visit

## 2025-06-10 ENCOUNTER — Ambulatory Visit: Admitting: Endocrinology
